# Patient Record
Sex: Male | Born: 1985 | Hispanic: Yes | Marital: Single | Smoking: Never smoker
Health system: Southern US, Community
[De-identification: ages and names within clinical notes are randomized; demographics above are authoritative.]

## PROBLEM LIST (undated history)

## (undated) HISTORY — PX: FOOT SURGERY: SHX648

---

## 2014-06-06 ENCOUNTER — Emergency Department (HOSPITAL_COMMUNITY)
Admission: EM | Admit: 2014-06-06 | Discharge: 2014-06-06 | Disposition: A | Discharge status: Home / Self Care | Payer: Self-pay | Attending: Emergency Medicine | Admitting: Emergency Medicine

## 2014-06-06 ENCOUNTER — Encounter (HOSPITAL_COMMUNITY): Payer: Self-pay | Admitting: *Deleted

## 2014-06-06 ENCOUNTER — Emergency Department (HOSPITAL_COMMUNITY): Payer: Self-pay

## 2014-06-06 DIAGNOSIS — M25462 Effusion, left knee: Secondary | ICD-10-CM | POA: Insufficient documentation

## 2014-06-06 DIAGNOSIS — M25562 Pain in left knee: Secondary | ICD-10-CM

## 2014-06-06 MED ORDER — HYDROCODONE-ACETAMINOPHEN 5-325 MG PO TABS
2.0000 | ORAL_TABLET | Freq: Once | ORAL | Status: AC
  Administered 2014-06-06: 2 via ORAL
  Filled 2014-06-06: qty 2

## 2014-06-06 MED ORDER — IBUPROFEN 800 MG PO TABS: 800.0000 mg | ORAL_TABLET | Freq: Three times a day (TID) | ORAL | Status: AC

## 2014-06-06 MED ORDER — HYDROCODONE-ACETAMINOPHEN 5-325 MG PO TABS: 1.0000 | ORAL_TABLET | Freq: Four times a day (QID) | ORAL | Status: AC | PRN

## 2014-06-06 NOTE — ED Notes (Signed)
Pt states woke up this am with L knee pain.  Pain increases with ambulation.  Some swelling, but no warmth or redness noted.

## 2014-06-06 NOTE — ED Notes (Signed)
Declined W/C at D/C and was escorted to lobby by RN. 

## 2014-06-06 NOTE — ED Provider Notes (Signed)
CSN: [id number]     Arrival date & time 06/06/14  0756 History   First MD Initiated Contact with Patient 06/06/14 [phone]     Chief Complaint  Patient presents with  . Knee Pain     (Consider location/radiation/quality/duration/timing/severity/associated sxs/prior Treatment) HPI Patient is a 29 year old male with no significant past medical history who presents the ER complaining of left-sided knee pain. Patient states approximately 3:00 this morning he woke up with acute onset of pain in his patellar region. Patient states his pain has been constant since, and painful with range of motion of his knee or with weightbearing. 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 Patient denies any numbness, weakness, tingling, redness, warmth, fever, nausea, vomiting.  History reviewed. No pertinent past medical history. Past Surgical History  Procedure Laterality Date  . Foot surgery Right     when pt was child   No family history on file. History  Substance Use Topics  . Smoking status: Never Smoker   . Smokeless tobacco: Not on file  . Alcohol Use: No    Review of Systems  Constitutional: Negative for fever.  Musculoskeletal: Positive for arthralgias.  Neurological: Negative for weakness and numbness.      Allergies  Review of patient's allergies indicates not on file.  Home Medications   Prior to Admission medications   Medication Sig Start Date End Date Taking? Authorizing Provider  HYDROcodone-acetaminophen (NORCO/VICODIN) 5-325 MG per tablet Take 1-2 tablets by mouth every 6 (six) hours as needed for moderate pain or severe pain. 06/06/14   Monte FantasiaJoseph W Raymondo Garcialopez, PA-C  ibuprofen (ADVIL,MOTRIN) 800 MG tablet Take 1 tablet (800 mg total) by mouth 3 (three) times daily. 06/06/14   Monte FantasiaJoseph W Ciara Kagan, PA-C   BP 111/69 mmHg  Pulse 69  Temp(Src) 97.8 F (36.6 C) (Oral)  Resp 20  Wt 200 lb (90.719 kg)  SpO2 97% Physical Exam  Constitutional:  He appears well-developed and well-nourished. No distress.  HENT:  Head: Normocephalic and atraumatic.  Eyes: Conjunctivae are normal. Right eye exhibits no discharge. Left eye exhibits no discharge. No scleral icterus.  Cardiovascular:  Peripheral pulses intact at injured extremity.   Pulmonary/Chest: Effort normal. No respiratory distress.  Musculoskeletal:  Left knee exam: Mild effusion noted to patellar region. Pain with manipulation of the patella with tenderness. Mild pain with range of motion to knee, full active and passive range of motion. No erythema, warmth noted to knee. DP pulse 2+, distal sensation intact. Motor strength 5 out of 5 at hip, knee, ankle.  Neurological: He is alert.  No numbness distal to injury.    Skin: Skin is warm and dry. No rash noted. He is not diaphoretic.  Nursing note and vitals reviewed.   ED Course  Procedures (including critical care time) Labs Review Labs Reviewed - No data to display  Imaging Review Dg Knee Complete 4 Views Left  06/06/2014   CLINICAL DATA:  Anterior left knee pain since last night. Small lacerations with swelling. No known injury.  EXAM: LEFT KNEE - COMPLETE 4+ VIEW  COMPARISON:  None.  FINDINGS: There is no evidence of fracture, dislocation, or joint effusion. There is no evidence of arthropathy or other focal bone abnormality. Soft tissues are unremarkable.  IMPRESSION: Negative.   Electronically Signed   By: Norva PavlovElizabeth  Brown M.D.   On: 06/06/2014 09:33     EKG Interpretation None      MDM   Final  diagnoses:  Left knee pain   Radiographs unremarkable for acute pathology. Patient neurovascularly intact with acute knee pain since last night without injury. No concern for septic arthritis or sepsis. Homan sign negative, pain is mostly anterior, Wells criteria for DVT 0, no concern for DVT. No instability noted on exam, soft tissue injury not excluded, however there is no obvious signs of a meniscal tear or tendon rupture  or tear. Patient's pain may have been exacerbated by his standing at work, we'll place patient in knee sleeve and discharged with pain medicine and instruction for RICE therapy. I discussed return precautions with patient, and strongly encouraged patient to follow up with orthopedics should his symptoms persist or worsen. Patient verbalizes understanding and agreement of this plan. I encouraged patient to call or return to the ER should he have a questions or concerns.  BP 111/69 mmHg  Pulse 69  Temp(Src) 97.8 F (36.6 C) (Oral)  Resp 20  Wt 200 lb (90.719 kg)  SpO2 97%  Signed,  Ladona Mow, PA-C 2:24 PM  Patient discussed with Dr. Benjiman Core, M.D.  Monte Fantasia, PA-C 06/06/14 1424  Juliet Rude. Rubin Payor, MD 06/06/14 [phone]

## 2014-06-06 NOTE — Discharge Instructions (Signed)
Knee Pain °The knee is the complex joint between your thigh and your lower leg. It is made up of bones, tendons, ligaments, and cartilage. The bones that make up the knee are: °· The femur in the thigh. °· The tibia and fibula in the lower leg. °· The patella or kneecap riding in the groove on the lower femur. °CAUSES  °Knee pain is a common complaint with many causes. A few of these causes are: °· Injury, such as: °¨ A ruptured ligament or tendon injury. °¨ Torn cartilage. °· Medical conditions, such as: °¨ Gout °¨ Arthritis °¨ Infections °· Overuse, over training, or overdoing a physical activity. °Knee pain can be minor or severe. Knee pain can accompany debilitating injury. Minor knee problems often respond well to self-care measures or get well on their own. More serious injuries may need medical intervention or even surgery. °SYMPTOMS °The knee is complex. Symptoms of knee problems can vary widely. Some of the problems are: °· Pain with movement and weight bearing. °· Swelling and tenderness. °· Buckling of the knee. °· Inability to straighten or extend your knee. °· Your knee locks and you cannot straighten it. °· Warmth and redness with pain and fever. °· Deformity or dislocation of the kneecap. °DIAGNOSIS  °Determining what is wrong may be very straight forward such as when there is an injury. It can also be challenging because of the complexity of the knee. Tests to make a diagnosis may include: °· Your caregiver taking a history and doing a physical exam. °· Routine X-rays can be used to rule out other problems. X-rays will not reveal a cartilage tear. Some injuries of the knee can be diagnosed by: °¨ Arthroscopy a surgical technique by which a small video camera is inserted through tiny incisions on the sides of the knee. This procedure is used to examine and repair internal knee joint problems. Tiny instruments can be used during arthroscopy to repair the torn knee cartilage (meniscus). °¨ Arthrography  is a radiology technique. A contrast liquid is directly injected into the knee joint. Internal structures of the knee joint then become visible on X-ray film. °¨ An MRI scan is a non X-ray radiology procedure in which magnetic fields and a computer produce two- or three-dimensional images of the inside of the knee. Cartilage tears are often visible using an MRI scanner. MRI scans have largely replaced arthrography in diagnosing cartilage tears of the knee. °· Blood work. °· Examination of the fluid that helps to lubricate the knee joint (synovial fluid). This is done by taking a sample out using a needle and a syringe. °TREATMENT °The treatment of knee problems depends on the cause. Some of these treatments are: °· Depending on the injury, proper casting, splinting, surgery, or physical therapy care will be needed. °· Give yourself adequate recovery time. Do not overuse your joints. If you begin to get sore during workout routines, back off. Slow down or do fewer repetitions. °· For repetitive activities such as cycling or running, maintain your strength and nutrition. °· Alternate muscle groups. For example, if you are a weight lifter, work the upper body on one day and the lower body the next. °· Either tight or weak muscles do not give the proper support for your knee. Tight or weak muscles do not absorb the stress placed on the knee joint. Keep the muscles surrounding the knee strong. °· Take care of mechanical problems. °¨ If you have flat feet, orthotics or special shoes may help.   See your caregiver if you need help. °¨ Arch supports, sometimes with wedges on the inner or outer aspect of the heel, can help. These can shift pressure away from the side of the knee most bothered by osteoarthritis. °¨ A brace called an "unloader" brace also may be used to help ease the pressure on the most arthritic side of the knee. °· If your caregiver has prescribed crutches, braces, wraps or ice, use as directed. The acronym  for this is PRICE. This means protection, rest, ice, compression, and elevation. °· Nonsteroidal anti-inflammatory drugs (NSAIDs), can help relieve pain. But if taken immediately after an injury, they may actually increase swelling. Take NSAIDs with food in your stomach. Stop them if you develop stomach problems. Do not take these if you have a history of ulcers, stomach pain, or bleeding from the bowel. Do not take without your caregiver's approval if you have problems with fluid retention, heart failure, or kidney problems. °· For ongoing knee problems, physical therapy may be helpful. °· Glucosamine and chondroitin are over-the-counter dietary supplements. Both may help relieve the pain of osteoarthritis in the knee. These medicines are different from the usual anti-inflammatory drugs. Glucosamine may decrease the rate of cartilage destruction. °· Injections of a corticosteroid drug into your knee joint may help reduce the symptoms of an arthritis flare-up. They may provide pain relief that lasts a few months. You may have to wait a few months between injections. The injections do have a small increased risk of infection, water retention, and elevated blood sugar levels. °· Hyaluronic acid injected into damaged joints may ease pain and provide lubrication. These injections may work by reducing inflammation. A series of shots may give relief for as long as 6 months. °· Topical painkillers. Applying certain ointments to your skin may help relieve the pain and stiffness of osteoarthritis. Ask your pharmacist for suggestions. Many over the-counter products are approved for temporary relief of arthritis pain. °· In some countries, doctors often prescribe topical NSAIDs for relief of chronic conditions such as arthritis and tendinitis. A review of treatment with NSAID creams found that they worked as well as oral medications but without the serious side effects. °PREVENTION °· Maintain a healthy weight. Extra pounds  put more strain on your joints. °· Get strong, stay limber. Weak muscles are a common cause of knee injuries. Stretching is important. Include flexibility exercises in your workouts. °· Be smart about exercise. If you have osteoarthritis, chronic knee pain or recurring injuries, you may need to change the way you exercise. This does not mean you have to stop being active. If your knees ache after jogging or playing basketball, consider switching to swimming, water aerobics, or other low-impact activities, at least for a few days a week. Sometimes limiting high-impact activities will provide relief. °· Make sure your shoes fit well. Choose footwear that is right for your sport. °· Protect your knees. Use the proper gear for knee-sensitive activities. Use kneepads when playing volleyball or laying carpet. Buckle your seat belt every time you drive. Most shattered kneecaps occur in car accidents. °· Rest when you are tired. °SEEK MEDICAL CARE IF:  °You have knee pain that is continual and does not seem to be getting better.  °SEEK IMMEDIATE MEDICAL CARE IF:  °Your knee joint feels hot to the touch and you have a high fever. °MAKE SURE YOU:  °· Understand these instructions. °· Will watch your condition. °· Will get help right away if you are not   doing well or get worse. Document Released: 01/31/2007 Document Revised: 06/28/2011 Document Reviewed: 01/31/2007 [facility] Patient Information 2015 [place], [place]. This information is not intended to replace advice given to you by your health care provider. Make sure you discuss any questions you have with your health care provider.  Dolor en la rodilla (Knee Pain) La rodilla es la articulacin compleja entre el muslo y la parte inferior de la pierna. En esta articulacin hay huesos, tendones, ligamentos y TEFL teacher. Los huesos que forman la rodilla son:  El fmur en el muslo.  La tibia y el peron en la pierna.  La rtula montada en la ranura de la parte inferior  del muslo. CAUSAS El dolor de rodilla es una causa frecuente de [place] y puede tener varias causas. Algunas son:  Lesiones como:  Ruptura de ligamento o lesin en el tendn.  Esguince del cartlago  Enfermedades como:  Gota.  Artritis.  Infecciones.  Uso excesivo, demasiado entrenamiento o mucha actividad fsica. El dolor de rodilla puede ser leve o intenso. Puede acompaar una lesin debilitante. Los problemas leves con frecuencia responden bien a tratamientos caseros o se mejoran por s mismas. Las lesiones ms graves pueden requerir la intervencin del mdico y [place]. SNTOMAS La rodilla es una articulacin compleja. Los sntomas pueden variar ampliamente Algunos son:  Dolor con el movimiento o al soportar peso.  Hinchazn y Engineer, mining.  Torsin de la rodilla.  Imposibilidad para estirar la rodilla.  La rodilla se traba y no puede enderezarla.  Siente calor y se observa enrojecimiento con dolor y [place].  Deformidad o dislocacin de la rtula. DIAGNSTICO Determinar cual es el problema puede ser bastante simple, como cuando hay una lesin. Tambin puede ser [employer] debido a la complejidad de la rodilla. Las pruebas para Education officer, environmental un diagnstico son:  Marily Memos y examen fsico por parte del mdico.  Radiografas para descartar otros problemas. Las radiografas no mostrarn la ruptura del TEFL teacher. Algunas lesiones en la rodilla pueden diagnosticarse del siguiente modo:  La artroscopia es una tcnica quirrgica por la que una pequea cmara de vdeo se inserta en pequeas incisiones que se hacen a los lados de la rodilla. Este procedimiento se [place] para examinar y Therapist, sports los problemas de la articulacin interna de la rodilla. Se utilizan pequeos instrumentos para reparar el cartlago roto (meniscos).  La artrografa es una tcnica radiolgica. Se inyecta un lquido de contraste en la articulacin de la rodilla. Las estructuras internas de la  articulacin de la rodilla se hacen visibles en una pelcula de rayos X.  Las imgenes por resonancia magntica son un procedimiento en el que los campos magnticos y una computadora producen imgenes en dos o tres dimensiones del interior de la rodilla. La ruptura del cartlago es visible con esta tcnica. La resonancia magntica ha reemplazado a la artrografa en el diagnstico de la ruptura del cartlago de la rodilla.  Anlisis de [place].  Examen del lquido que lubrica la articulacin de la rodilla (lquido sinovial). Se realiza tomando [place] con [place]. TRATAMIENTO El tratamiento de los problemas de la rodilla depende fundamentalmente de la causa. Algunos de estos tratamientos son:  Segn sea la lesin, un yeso o entablillado, ciruga o fisioterapia.  Permtase el tiempo adecuado de recuperacin. No use demasiado su extremidad lesionada. Si siente dolor durante los ejercicios de rutina, suspndalos. Hgalos ms lentos o realice menos repeticiones.  En el caso de actividades repetitivas como andar en bicicleta o correr, mantenga la fuerza  y Neomia Dear buena nutricin.  Alterne los grupos musculares. Por ejemplo, si levanta pesas, trabaje la parte superior del cuerpo Civil engineer, contracting, y la parte inferior al da siguiente.  Ni los msculos firmes ni los dbiles proporcionan un sostn adecuado a la rodilla. Los msculos no absorben el estrs que se ejerce sobre la articulacin de la rodilla. Mantenga fuertes los msculos que rodean a la rodilla.  Cudese de los problemas mecnicos:  Si tiene pie plano, los zapatos ortopdicos o especiales pueden ayudar. Comunquese con el profesional que lo asiste si necesita ayuda adicional.  Los soporte de arco con bordes en la zona interna o interna del taco pueden ayudar. Cambian la presin del lado de la rodilla ms comprometido por la osteoartritis.  Podrn colocarle una ortesis de rodilla para aliviar la presin en la zona ms artrtica de la  rodilla.  Si el profesional le ha prescripto muletas, ortesis, un vendaje o hielo, hgalo segn las indicaciones. El acrnimo para este tratamiento es PRICE. Significa proteccin, reposo, hielo, compresin y elevacin.  Los antiinflamatorios no esteroides, pueden ayudar a Engineer, materials. Pero si se toman inmediatamente luego de la lesin, podran aumentar la hinchazn. Tome los corticoides luego de Clinical cytogeneticist. Suspndalos si tiene problemas estomacales. No los tome si tiene una historia de Occupational hygienist, Engineer, mining en el estmago o hemorragia intestinal. No lo tome sin la aprobacin del profesional que la asiste si tiene problemas de retencin de lquidos, insuficencia cardaca o problemas renales.  En los casos crnicos, la fisioterapia puede ser de [place].  La glucosamina y el condroitin son suplementos dietarios de Sales promotion account executive. Ambos pueden Engineer, materials de la osteoartritis de la rodilla. Estos medicamentos son diferentes de los antiinflamatorios habituales. La glucosamina puede disminuir el porcentaje de destruccin del cartlago.  Las inyecciones de corticoides en la articulacin de la rodilla reducen los sntomas de un brote de artritis. Ofrecen alivio que dura algunos meses. Hay que esperar algunos meses entre la aplicacin de inyecciones. Las inyecciones tiene un pequeo riesgo de infeccin, retencin de lquidos y Engineer, structural de los niveles de Production assistant, radio.  El cido hialurnico inyectado en las articulaciones lesionadas puede aliviar el dolor y proporciona lubricacin. Estas inyecciones funcionan bien reduciendo la inflamacin. Una serie de inyecciones puede proporcionar alivio durante seis meses.  Analgsicos locales. Aplicar ciertos ungentos sobre la piel puede ayudar a Engineer, materials y la rigidez de la osteoartritis. Consulte con el farmacutico, si es necesario. Muchos medicamentos de venta libre estn aprobados para el alivio temporario del dolor artrtico.  En algunos pases los mdicos  prescriben antiinflamatorios no esteroides para el alivio de los trastornos crnicos como la artritis y la tendinitis. Un estudio del tratamiento con antiinflamatorios no esteroides aplicados en crema, demostr que funcionaban bien, as como administrados por va oral, pero sin el peligro de los [place]. PREVENCIN  Mantenga un peso normal. Los kilos de ms agregan tensin a las articulaciones.  Mantngase fuerte y gil. Los msculos dbiles son Neomia Dear causa frecuente de lesiones en la rodilla. La elongacin es importante. Incluya ejercicios de flexibilidad en sus rutinas.  Practique actividad fsica con inteligencia. Si sufre osteoartritis, dolor crnico en la rodilla o lesiones recurrentes, podr ser necesario que modifique el modo en que se ejercita. No significa que deba volverse inactivo. Si le duelen las rodillas despus de correr o jugar basketball, considere la prctica de la natacin, ejercicios aerbicos en el agua u otras actividades de bajo [place], al menos durante [address] o [place]  semanas. En algunos casos, el [employer] [address] de alto impacto ofrece [place].  Asegrese que sus zapatos le [place]. Elija el calzado deportivo adecuado para su deporte.  Proteja sus rodillas. Use la proteccin adecuada para las actividades que puedan afectar a sus rodillas. Use rodilleras cuando juegue al vley o se arrodille. Colquese el cinturn de seguridad cada vez que conduzca. La mayor parte de las fracturas de rtula ocurren en accidentes automvilsticos.  Descanse cuando se sienta cansado. SOLICITE ATENCIN MDICA SI: Tiene dolor en la rodilla que es continuo y no parece mejorar.  SOLICITE ATENCIN MDICA DE INMEDIATO SI:  La articulacin de la rodilla se siente caliente al tacto y usted tiene fiebre. EST SEGURO QUE:   Comprende las instrucciones para el alta mdica.  Controlar su enfermedad.  Solicitar atencin mdica de inmediato segn las indicaciones. Document  Released: 09/22/2007 Document Revised: 06/28/2011 [facility] Patient Information 2015 [place], [place]. This information is not intended to replace advice given to you by your health care provider. Make sure you discuss any questions you have with your health care provider.   Emergency Department Resource Guide 1) Find a Doctor and Pay Out of Pocket Although you won't have to find out who is covered by your insurance plan, it is a good idea to ask around and get recommendations. You will then need to call the office and see if the doctor you have chosen will accept you as a new patient and what types of options they offer for patients who are self-pay. Some doctors offer discounts or will set up payment plans for their patients who do not have insurance, but you will need to ask so you aren't surprised when you get to your appointment.  2) Contact Your Local Health Department Not all health departments have doctors that can see patients for sick visits, but many do, so it is worth a call to see if yours does. If you don't know where your local health department is, you can check in your phone book. The CDC also has a tool to help you locate your state's health department, and many state websites also have listings of all of their local health departments.  3) Find a Walk-in Clinic If your illness is not likely to be very severe or complicated, you may want to try a walk in clinic. These are popping up all over the country in pharmacies, drugstores, and shopping centers. They're usually staffed by nurse practitioners or physician assistants that have been trained to treat common illnesses and complaints. They're usually fairly quick and inexpensive. However, if you have serious medical issues or chronic medical problems, these are probably not your best option.  No Primary Care Doctor: - Call Health Connect at  [phone] - they can help you locate a primary care doctor that  accepts your insurance,  provides certain services, etc. - Physician Referral Service- [phone]  Chronic Pain Problems: Organization         Address  Phone   Notes  [facility]  [phone] Patients need to be referred by their primary care doctor.   Medication Assistance: Organization         Address  Phone   Notes  [facility] [address] [place] [place], [place] [address] [phone] --Must be a resident of [facility] -- Must have NO insurance coverage whatsoever (no Medicaid/ Medicare, etc.) -- The pt. MUST have a primary care doctor that directs their  care regularly and follows them in the community   [facility]  [phone]   [employer]  [phone]    Agencies that provide inexpensive medical care: Organization         Address  Phone   Notes  [facility]  [phone]   [facility]    [phone]   [facility] [address] [place], [place] [address] [phone]   [facility] [address] [place], [place] [phone]   Planned Parenthood    [phone]   [facility]    [phone]   [facility]  [address], [place] Phone:  [phone], Fax:  [phone] Hours of Operation:  9 am - 6 pm, M-F.  Also accepts Medicaid/Medicare and self-pay.  [facility]  [address], [place] Phone: [phone], Fax: [phone]. Hours of Operation:  8:30 am - 5:30 pm, M-F.  Also accepts Medicaid and self-pay.  [facility] [place] [address] [place] Phone: [phone]   [facility] [address] Natasha Bence [place], [place] [phone], Ext. [phone] Mondays & Thursdays: 7-9 AM.  First 15 patients are seen on a first come, first serve basis.    Medicaid-accepting [facility] Providers:  Organization         Address  Phone    Notes  [facility] [address],  [phone] Also accepts self-pay patients.  [facility] [address] Laurell Josephs [place], [place]  [phone]   [facility] [address], [place] [phone]   [facility] [address] [place] [phone]   Renaye Rakers [address], [place]   [phone] Only accepts [place] patients after they have their name applied to their card.   Self-Pay (no insurance) in [facility]:  Organization         Address  Phone   Notes  Sickle Cell Patients, [facility] [address] [place], [place] [phone]   [facility] [address] [place], [place] [phone]   [facility] [place]  [address], [place] [phone]   [facility]/Dr. Osei-Bonsu  [address] [place] or [address], [place] [phone] Phone number for both [place] and [place] locations is the same.  [facility] [address], [place] [phone]   [facility] [address] [place] or [address] [phone]   [facility] [address], [place] [phone], phone; [phone], fax Sees patients 1st and 3rd Saturday of every month.  Must not qualify for public or private insurance (i.e. Medicaid, Medicare, [place] Health Choice, Veterans' Benefits)  Household income should be no more than 200% of the poverty level The clinic cannot treat you if you are pregnant or think you are pregnant  Sexually transmitted diseases are not treated at the clinic.    Dental Care: Organization         Address  Phone  Notes  [facility] Department of [facility] [address] [place], [place] [phone] Accepts children up to age  21 who are enrolled in Medicaid or [place] Health Choice; pregnant women with a Medicaid card; and children who have applied for Medicaid or [place] Health Choice, but were declined, whose parents can pay a reduced fee at time of service.  [facility] Department of [facility]  [address], [place] [phone] Accepts children up to age 1 who are enrolled in [place] or [place] Health Choice; pregnant women with a Medicaid card; and children who have applied for Medicaid or [place] Health Choice, but were declined, whose parents can pay a reduced fee at time of service.  [facility]  [address] [place], [place] [phone] Patients are seen by appointment only. Walk-ins are not accepted. [facility] will see patients 17 years of age and older. Monday - Tuesday (8am-5pm) Most Wednesdays (8:30-5pm) $30 per visit, cash only  [facility] Adult [facility]  [address], [facility] [phone] Patients are seen by appointment only. Walk-ins are not accepted. [facility] will see patients 11 years of age and older. One Wednesday Evening (Monthly: Volunteer Based).  $30 per visit, cash only  [employer]  [phone] for adults; Children under age 80, call Graduate Pediatric Dentistry at [phone]. Children aged 54-14, please call [phone] to request a pediatric application.  Dental services are provided in all areas of dental care including fillings, crowns and bridges, complete and partial dentures, implants, gum treatment, root canals, and extractions. Preventive care is also provided. Treatment is provided to both adults and children. Patients are selected via a lottery and there is often a waiting list.   [facility] [address], [place]  [phone] [email or web address]   [facility] [address] [place], [place] [phone], Ext.  [phone] Second and Fourth Thursday of each month, opens at 6:30 AM; Clinic ends at 9 AM.  Patients are seen on a first-come first-served basis, and a limited number are seen during each clinic.   [facility]  [address] [address] [place], [place] [phone]   Eligibility Requirements You must have lived in [place], [place], or [place] counties for at least the last three months.   You cannot be eligible for state or federal sponsored [place], including [employer], [place], or [employer].   You generally cannot be eligible for healthcare insurance through your employer.    How to apply: Eligibility screenings are held every Tuesday and Wednesday afternoon from 1:00 pm until 4:00 pm. You do not need an appointment for the interview!  [facility] [address] [place], [place] [phone]   [facility] Health Department  [phone]   [facility] Health Department  [phone]   [facility]  [phone]    Behavioral Health Resources in the Community: Intensive Outpatient Programs Organization         Address  Phone  Notes  [facility] [address] [place], [place] [phone]   [facility] Outpatient [address] [place], [place] [phone]   [facility] [address], [place], [place]  [phone]   [facility] [address]  [place], [place] [phone] or [phone]   Substance Abuse Resources Organization         Address  Phone  Notes  [facility]  [phone]   [facility]  [phone]   [facility]  [phone]   [facility]  [phone]   Residential & Outpatient Substance Abuse Program  [phone]   Psychological Services Organization         Address  Phone  Notes  [facility]  [phone]   [facility]  [phone]   [facility] [address], [place] [phone] or [phone]    Mobile Crisis Teams Organization         Address  Phone  Notes  [facility], Mobile Crisis Care Unit  [phone]   [facility]  [address] [place], [place] [phone]   Doristine LocksSharon DeEsch [address] [place] [phone]    Self-Help/Support Groups Organization         Address  Phone             Notes  [facility] [place] - variety of support groups  [phone] Call for more information  Narcotics Anonymous (NA), [facility] [address], [place]  2 meetings at this location   [facility]ian[facility] Treatment Programs Organization         Address  Phone  Notes  [facility] [address],    [place]  [phone]   [facility]  [address], [place] [phone], [place], [place] [phone]   [facility] [address], [place] [phone] Admissions: 8am-3pm M-F  [facility] [address]    [place], [place] [phone]   [facility] [address], [place], [place] [phone]   [facility] [address]  [place], [place] [phone]   [facility] - Intensive Outpatient [address], [place], [place] [phone]   [facility] [address]  [place], [place] [phone] or [phone]   [facility] ([facility]) [address] [place], [place] [phone] Accepts Medicaid  [facility] [address]  [place] [phone] Substance Abuse/Addiction Treatment   [facility] Behavioral Health Resources Organization         Address  Phone  Notes  [facility]  [phone]   Angie FavaJulie Brannon, PhD [address], [address] [place], [place]   [phone](336) [phone] or [phone](336) [phone]   [facility]   [address] [place], [place] [phone]) [phone]   [facility] [address], [place], [place] [phone]) [phone] Insurance/Medicaid/sponsorship through [facility]  [facility] [address]                                    [place], [place] [phone] Therapy/tele-psych/case  [facility] [address].   [place], [place] [phone]) [phone]    Dr. Lolly MustacheArfeen  [phone]   [facility]  [employer] [facility] 1) [address], [place] 2) [address], [place] 3)  [address], [place] [phone]  [phone]([phone]   [facility] Child Abuse Hotline [phone](336) [phone] or [phone](336) [phone] (After Hours)

## 2017-02-16 IMAGING — CR DG KNEE COMPLETE 4+V*L*
4 series · 4 of 4 positions shown · non-contrast
Comparison: None.

CLINICAL DATA: Anterior left knee pain since last night. Small
lacerations with swelling. No known injury.

EXAM:
LEFT KNEE - COMPLETE 4+ VIEW

[knee ap]
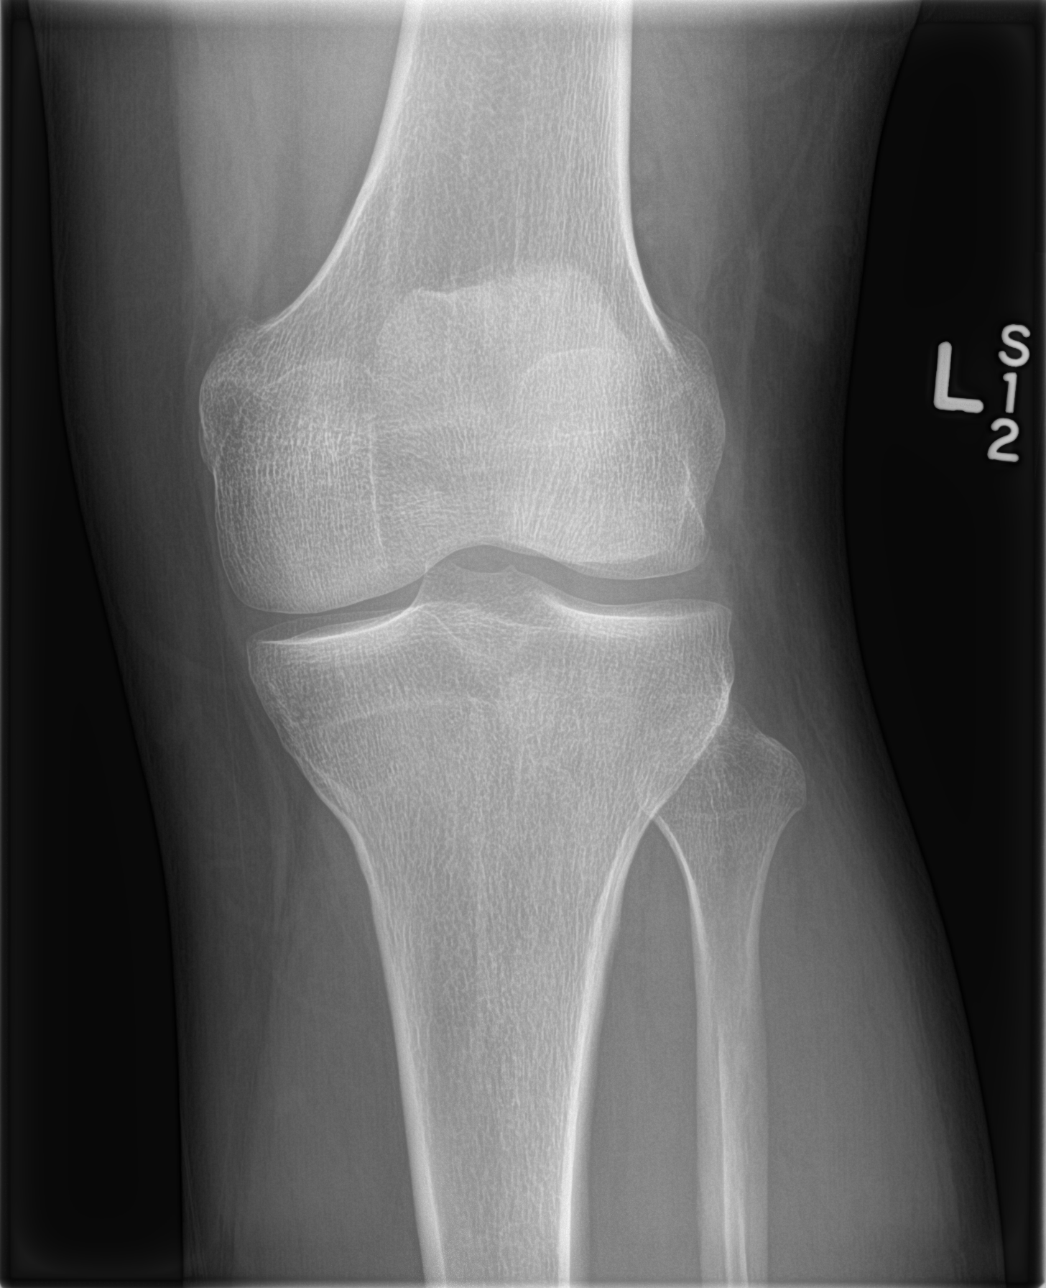

[knee obl (1 of 2)]
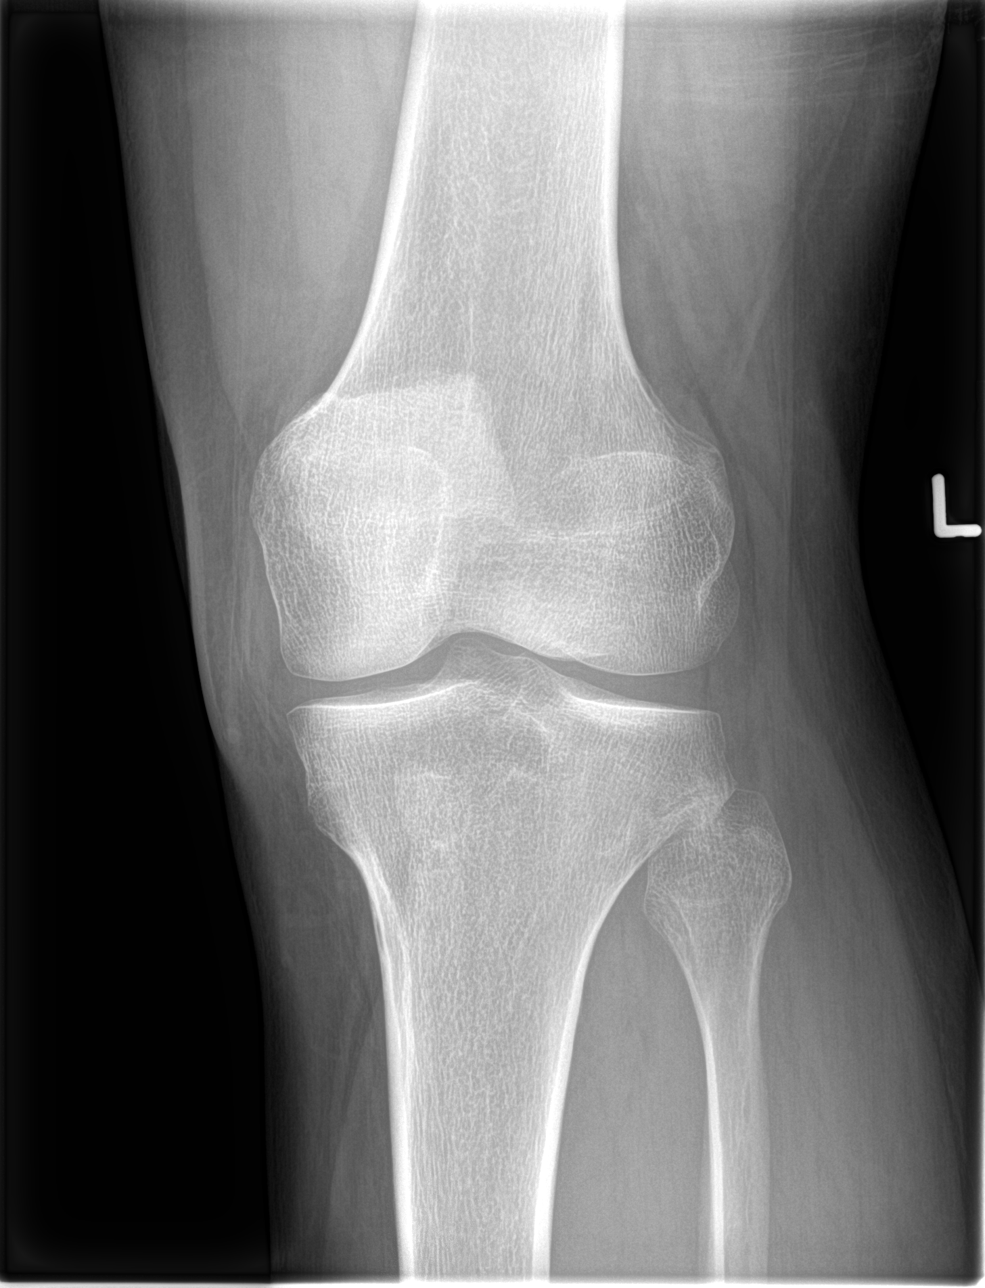

[knee obl (2 of 2)]
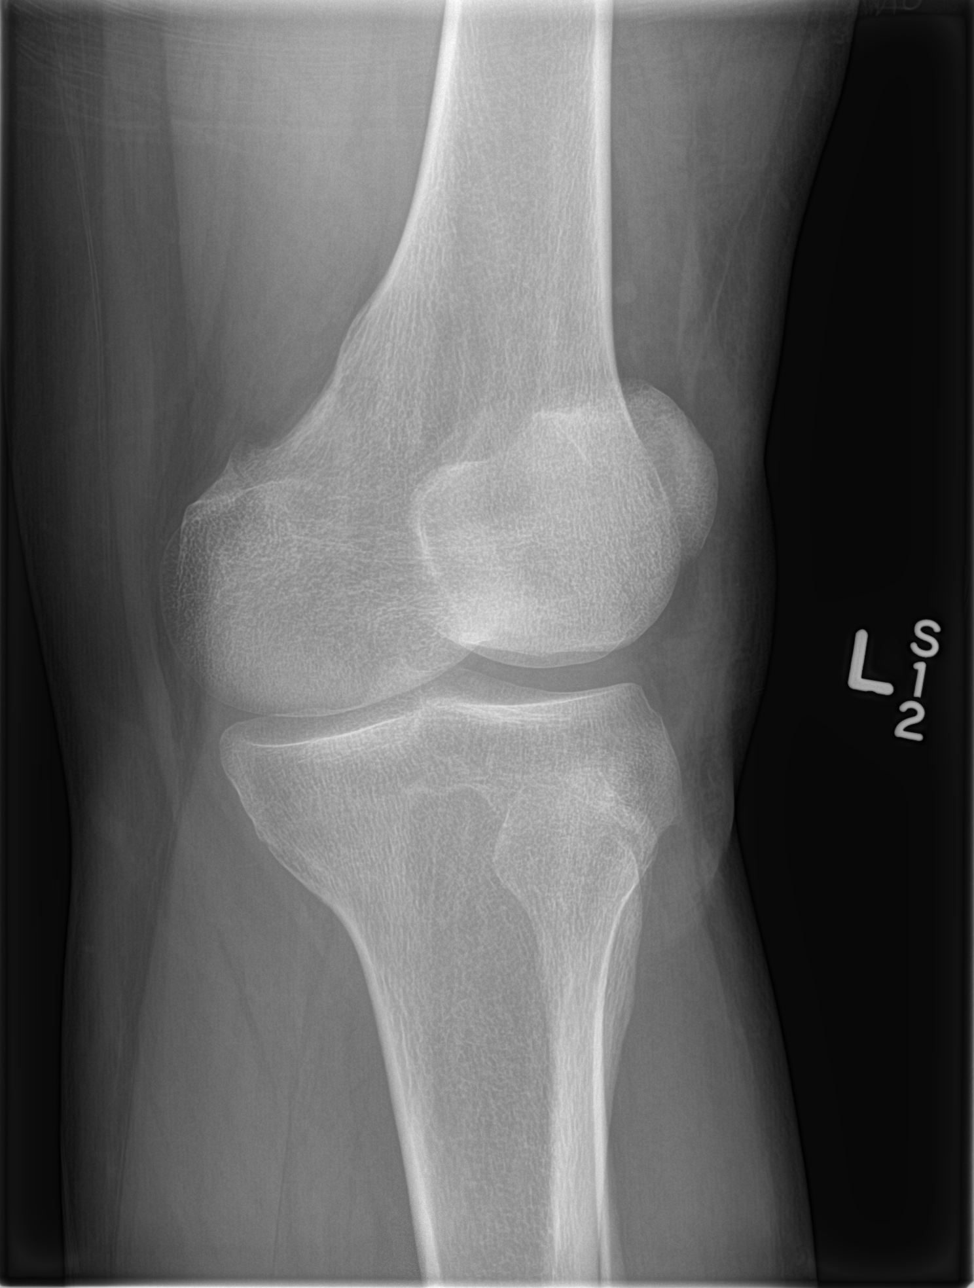

[knee lat]
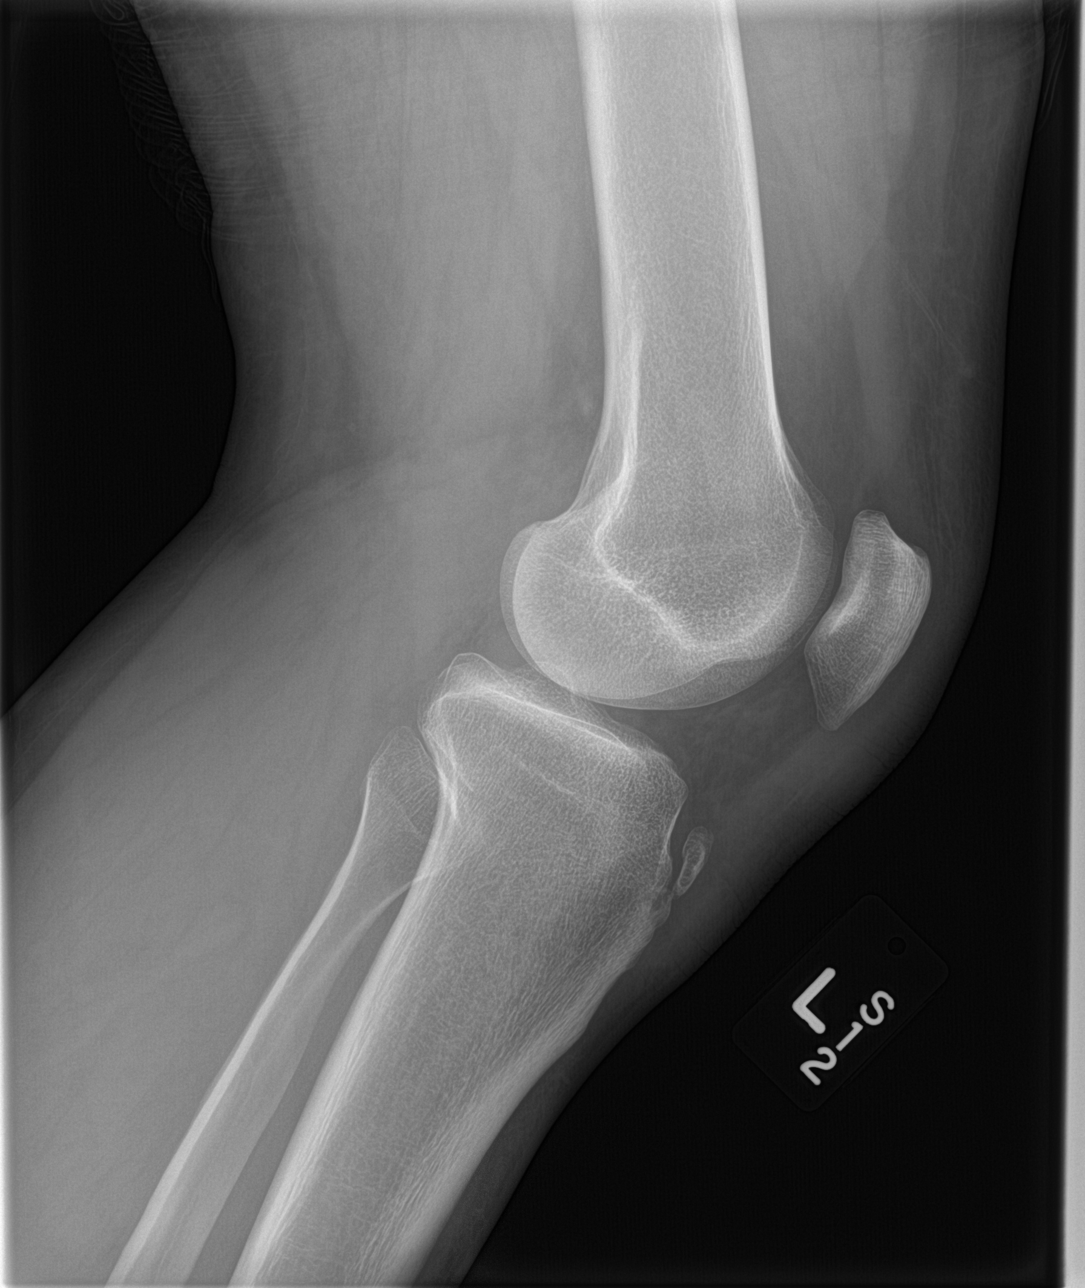

[4 of 4 positions shown; findings below may reference images not displayed]

FINDINGS: There is no evidence of fracture, dislocation, or joint effusion.
There is no evidence of arthropathy or other focal bone abnormality.
Soft tissues are unremarkable.
IMPRESSION: Negative.
